# Patient Record
Sex: Female | Born: 1980 | Race: White | Hispanic: Yes | Marital: Single | Smoking: Former smoker
Health system: Southern US, Community
[De-identification: ages and names within clinical notes are randomized; demographics above are authoritative.]

## PROBLEM LIST (undated history)

## (undated) DIAGNOSIS — B009 Herpesviral infection, unspecified: Secondary | ICD-10-CM

## (undated) HISTORY — DX: Herpesviral infection, unspecified: B00.9

## (undated) HISTORY — PX: TUBAL LIGATION: SHX77

---

## 2007-07-24 ENCOUNTER — Emergency Department (HOSPITAL_COMMUNITY): Admission: EM | Admit: 2007-07-24 | Discharge: 2007-07-25 | Payer: Self-pay | Admitting: Emergency Medicine

## 2008-04-24 ENCOUNTER — Encounter (INDEPENDENT_AMBULATORY_CARE_PROVIDER_SITE_OTHER): Payer: Self-pay | Admitting: *Deleted

## 2008-04-24 ENCOUNTER — Ambulatory Visit (HOSPITAL_BASED_OUTPATIENT_CLINIC_OR_DEPARTMENT_OTHER): Admission: RE | Admit: 2008-04-24 | Discharge: 2008-04-24 | Payer: Self-pay | Admitting: *Deleted

## 2009-09-03 ENCOUNTER — Other Ambulatory Visit: Admission: RE | Admit: 2009-09-03 | Discharge: 2009-09-03 | Payer: Self-pay | Admitting: Obstetrics and Gynecology

## 2011-04-27 NOTE — Op Note (Signed)
NAMEKAMIA, INSALACO               ACCOUNT NO.:  [id number]   MEDICAL RECORD NO.:  [id number]          PATIENT TYPE:  AMB   LOCATION:  DSC                          FACILITY:  [facility]   PHYSICIAN:  Tennis Must Meyerdierks, M.D.DATE OF BIRTH:  April 06, 1981   DATE OF PROCEDURE:  04/24/2008  DATE OF DISCHARGE:                               OPERATIVE REPORT   PREOPERATIVE DIAGNOSIS:  Volar ganglion, right wrist.   POSTOPERATIVE DIAGNOSIS:  Volar ganglion, right wrist.   PROCEDURE:  Excision of volar ganglion, right wrist.   SURGEON:  Lowell Bouton, MD   ANESTHESIA:  General.   OPERATIVE FINDINGS:  The patient had a multilobulated ganglion that  appeared to arise from the radioscaphoid joint.  It was densely adherent  to the radial artery.   PROCEDURE:  Under general anesthesia with the tourniquet on the right  arm, the right hand was prepped and draped in usual fashion and after  exsanguinating the limb, the tourniquet was inflated to 250 mmHg.  Gently curved longitudinal incision was made over the radial side of the  wrist and carried down through the subcutaneous tissues.  Bleeding  points were coagulated.  Blunt dissection was carried down to the mass  and the radial artery was identified proximally and distally.  It was  dissected out and branches towards the cyst were coagulated.  The artery  was then retracted and protected.  The cyst was then dissected out and  the vena comitans were coagulated.  The cyst was traced down to the  volar ligaments and appeared to arise from the radioscaphoid joint.  The  volar ligaments were incised and the cyst was completely traced down to  the joint and the stalk was removed.  The joint was examined with a  Therapist, nutritional and appeared to be smooth.  The capsule of the joint was  coagulated.  The ligament was then closed with 4-0 Vicryl suture.  The  tourniquet was released and bleeding was controlled with electrocautery.  A vessel  loop drain was left in for drainage and the subcutaneous tissue  was closed with 4-0 Vicryl.  The skin with a 3-0 subcuticular Prolene.  Steri-Strips were applied and 0.5% Marcaine was placed in the skin edges  for pain control.  The patient was placed in volar wrist splint.  She  tolerated the procedure well and went to the recovery room awake and  stable in good condition.      Lowell Bouton, M.D.  Electronically Signed     EMM/MEDQ  D:  04/24/2008  T:  04/25/2008  Job:  [id number]   cc:   Laverle Hobby, M.D.

## 2011-09-09 ENCOUNTER — Other Ambulatory Visit (HOSPITAL_COMMUNITY)
Admission: RE | Admit: 2011-09-09 | Discharge: 2011-09-09 | Disposition: A | Discharge status: Home / Self Care | Payer: Self-pay | Source: Ambulatory Visit | Attending: Obstetrics & Gynecology | Admitting: Obstetrics & Gynecology

## 2011-09-09 ENCOUNTER — Other Ambulatory Visit: Payer: Self-pay | Admitting: Obstetrics & Gynecology

## 2011-09-09 DIAGNOSIS — Z01419 Encounter for gynecological examination (general) (routine) without abnormal findings: Secondary | ICD-10-CM | POA: Insufficient documentation

## 2011-09-27 LAB — DIFFERENTIAL
Basophils Absolute: 0
Basophils Relative: 1
Eosinophils Absolute: 0
Eosinophils Relative: 1
Lymphocytes Relative: 21
Lymphs Abs: 1.5
Monocytes Absolute: 0.5
Monocytes Relative: 6
Neutro Abs: 5.1
Neutrophils Relative %: 72

## 2011-09-27 LAB — BASIC METABOLIC PANEL
BUN: 7
CO2: 22
Calcium: 9.2
Chloride: 107
Creatinine, Ser: 0.69
GFR calc Af Amer: >60
GFR calc non Af Amer: >60
Glucose, Bld: 127 — ABNORMAL HIGH
Potassium: 3.3 — ABNORMAL LOW
Sodium: 137

## 2011-09-27 LAB — CBC
HCT: 37.5
Hemoglobin: 13
MCHC: 34.8
MCV: 82.2
Platelets: 207
RBC: 4.57
RDW: 13.3
WBC: 7.1

## 2011-09-27 LAB — POCT CARDIAC MARKERS
CKMB, poc: <1 — ABNORMAL LOW
Myoglobin, poc: 45.5
Operator id: 240821
Troponin i, poc: <0.05

## 2015-04-16 ENCOUNTER — Ambulatory Visit (HOSPITAL_COMMUNITY)
Admission: RE | Admit: 2015-04-16 | Discharge: 2015-04-16 | Disposition: A | Discharge status: Home / Self Care | Payer: BLUE CROSS/BLUE SHIELD | Source: Ambulatory Visit | Attending: General Surgery | Admitting: General Surgery

## 2015-04-16 ENCOUNTER — Other Ambulatory Visit (HOSPITAL_COMMUNITY): Payer: Self-pay | Admitting: General Surgery

## 2015-04-16 DIAGNOSIS — Z87891 Personal history of nicotine dependence: Secondary | ICD-10-CM | POA: Insufficient documentation

## 2015-04-16 DIAGNOSIS — R079 Chest pain, unspecified: Secondary | ICD-10-CM | POA: Diagnosis present

## 2015-04-16 DIAGNOSIS — R509 Fever, unspecified: Secondary | ICD-10-CM

## 2015-04-16 DIAGNOSIS — R918 Other nonspecific abnormal finding of lung field: Secondary | ICD-10-CM | POA: Insufficient documentation

## 2015-04-17 ENCOUNTER — Emergency Department (HOSPITAL_COMMUNITY): Payer: BLUE CROSS/BLUE SHIELD

## 2015-04-17 ENCOUNTER — Encounter (HOSPITAL_COMMUNITY): Payer: Self-pay | Admitting: Emergency Medicine

## 2015-04-17 ENCOUNTER — Emergency Department (HOSPITAL_COMMUNITY)
Admission: EM | Admit: 2015-04-17 | Discharge: 2015-04-17 | Disposition: A | Discharge status: Home / Self Care | Payer: BLUE CROSS/BLUE SHIELD | Attending: Emergency Medicine | Admitting: Emergency Medicine

## 2015-04-17 DIAGNOSIS — J40 Bronchitis, not specified as acute or chronic: Secondary | ICD-10-CM | POA: Diagnosis not present

## 2015-04-17 DIAGNOSIS — R52 Pain, unspecified: Secondary | ICD-10-CM

## 2015-04-17 DIAGNOSIS — R05 Cough: Secondary | ICD-10-CM | POA: Diagnosis present

## 2015-04-17 DIAGNOSIS — Z79899 Other long term (current) drug therapy: Secondary | ICD-10-CM | POA: Insufficient documentation

## 2015-04-17 DIAGNOSIS — R091 Pleurisy: Secondary | ICD-10-CM | POA: Diagnosis not present

## 2015-04-17 LAB — CBC WITH DIFFERENTIAL/PLATELET
Basophils Absolute: 0 10*3/uL (ref 0.0–0.1)
Basophils Relative: 0 % (ref 0–1)
Eosinophils Absolute: 0.2 10*3/uL (ref 0.0–0.7)
Eosinophils Relative: 2 % (ref 0–5)
HCT: 34.4 % — ABNORMAL LOW (ref 36.0–46.0)
Hemoglobin: 11.5 g/dL — ABNORMAL LOW (ref 12.0–15.0)
Lymphocytes Relative: 23 % (ref 12–46)
Lymphs Abs: 1.9 10*3/uL (ref 0.7–4.0)
MCH: 24.7 pg — ABNORMAL LOW (ref 26.0–34.0)
MCHC: 33.4 g/dL (ref 30.0–36.0)
MCV: 73.8 fL — ABNORMAL LOW (ref 78.0–100.0)
Monocytes Absolute: 0.6 10*3/uL (ref 0.1–1.0)
Monocytes Relative: 8 % (ref 3–12)
Neutro Abs: 5.5 10*3/uL (ref 1.7–7.7)
Neutrophils Relative %: 67 % (ref 43–77)
Platelets: 224 10*3/uL (ref 150–400)
RBC: 4.66 MIL/uL (ref 3.87–5.11)
RDW: 15.3 % (ref 11.5–15.5)
WBC: 8.2 10*3/uL (ref 4.0–10.5)

## 2015-04-17 LAB — COMPREHENSIVE METABOLIC PANEL
ALT: 36 U/L (ref 14–54)
AST: 31 U/L (ref 15–41)
Albumin: 4 g/dL (ref 3.5–5.0)
Alkaline Phosphatase: 67 U/L (ref 38–126)
Anion gap: 7 (ref 5–15)
BUN: 14 mg/dL (ref 6–20)
CO2: 24 mmol/L (ref 22–32)
Calcium: 9 mg/dL (ref 8.9–10.3)
Chloride: 107 mmol/L (ref 101–111)
Creatinine, Ser: 0.7 mg/dL (ref 0.44–1.00)
GFR calc Af Amer: >60 mL/min (ref >60)
GFR calc non Af Amer: >60 mL/min (ref >60)
Glucose, Bld: 104 mg/dL — ABNORMAL HIGH (ref 70–99)
Potassium: 3.5 mmol/L (ref 3.5–5.1)
Sodium: 138 mmol/L (ref 135–145)
Total Bilirubin: 0.9 mg/dL (ref 0.3–1.2)
Total Protein: 7.1 g/dL (ref 6.5–8.1)

## 2015-04-17 LAB — D-DIMER, QUANTITATIVE (NOT AT ARMC): D-Dimer, Quant: 0.78 ug/mL-FEU — ABNORMAL HIGH (ref 0.00–0.48)

## 2015-04-17 MED ORDER — IOHEXOL 350 MG/ML SOLN
100.0000 mL | Freq: Once | INTRAVENOUS | Status: AC | PRN
  Administered 2015-04-17: 100 mL via INTRAVENOUS

## 2015-04-17 MED ORDER — SODIUM CHLORIDE 0.9 % IV BOLUS (SEPSIS)
500.0000 mL | Freq: Once | INTRAVENOUS | Status: AC
  Administered 2015-04-17: 500 mL via INTRAVENOUS

## 2015-04-17 MED ORDER — HYDROMORPHONE HCL 1 MG/ML IJ SOLN
0.5000 mg | Freq: Once | INTRAMUSCULAR | Status: AC
  Administered 2015-04-17: 0.5 mg via INTRAVENOUS
  Filled 2015-04-17: qty 1

## 2015-04-17 MED ORDER — ONDANSETRON HCL 4 MG/2ML IJ SOLN
4.0000 mg | Freq: Once | INTRAMUSCULAR | Status: AC
  Administered 2015-04-17: 4 mg via INTRAVENOUS
  Filled 2015-04-17: qty 2

## 2015-04-17 MED ORDER — TRAMADOL HCL 50 MG PO TABS: 50.0000 mg | ORAL_TABLET | Freq: Four times a day (QID) | ORAL | Status: DC | PRN

## 2015-04-17 NOTE — ED Provider Notes (Signed)
CSN: [id number]     Arrival date & time 04/17/15  1648 History   First MD Initiated Contact with Patient 04/17/15 1725     Chief Complaint  Patient presents with  . Pneumonia     (Consider location/radiation/quality/duration/timing/severity/associated sxs/prior Treatment) Patient is a 34 y.o. female presenting with cough. The history is provided by the patient (pt complains of a cough and left chest pain.  chest x-ray shows infiltrate).  Cough Cough characteristics:  Non-productive Severity:  Moderate Onset quality:  Gradual Timing:  Constant Progression:  Waxing and waning Chronicity:  New Associated symptoms: no chest pain, no eye discharge, no headaches and no rash     History reviewed. No pertinent past medical history. Past Surgical History  Procedure Laterality Date  . Cesarean section     History reviewed. No pertinent family history. History  Substance Use Topics  . Smoking status: Never Smoker   . Smokeless tobacco: Not on file  . Alcohol Use: No   OB History    No data available     Review of Systems  Constitutional: Negative for appetite change and fatigue.  HENT: Negative for congestion, ear discharge and sinus pressure.   Eyes: Negative for discharge.  Respiratory: Positive for cough.   Cardiovascular: Negative for chest pain.  Gastrointestinal: Negative for abdominal pain and diarrhea.  Genitourinary: Negative for frequency and hematuria.  Musculoskeletal: Negative for back pain.  Skin: Negative for rash.  Neurological: Negative for seizures and headaches.  Psychiatric/Behavioral: Negative for hallucinations.      Allergies  Review of patient's allergies indicates no known allergies.  Home Medications   Prior to Admission medications   Medication Sig Start Date End Date Taking? Authorizing Provider  levofloxacin (LEVAQUIN) 500 MG tablet Take 500 mg by mouth daily. 7 day course starting on 04/16/2015 04/16/15  Yes Historical Provider, MD  Multiple  Vitamin (MULTIVITAMIN WITH MINERALS) TABS tablet Take 1 tablet by mouth daily.   Yes Historical Provider, MD  naproxen sodium (ANAPROX) 220 MG tablet Take 220-440 mg by mouth daily as needed (for pain).   Yes Historical Provider, MD  traMADol (ULTRAM) 50 MG tablet Take 1 tablet (50 mg total) by mouth every 6 (six) hours as needed. 04/17/15   Bethann BerkshireJoseph Kullen Tomasetti, MD   BP 109/60 mmHg  Pulse 70  Temp(Src) 98.5 F (36.9 C) (Oral)  Resp 18  Ht 5\' 4"  (1.626 m)  Wt 192 lb (87.091 kg)  BMI 32.94 kg/m2  SpO2 100%  LMP 04/16/2015 Physical Exam  Constitutional: She is oriented to person, place, and time. She appears well-developed.  HENT:  Head: Normocephalic.  Eyes: Conjunctivae and EOM are normal. No scleral icterus.  Neck: Neck supple. No thyromegaly present.  Cardiovascular: Normal rate and regular rhythm.  Exam reveals no gallop and no friction rub.   No murmur heard. Pulmonary/Chest: No stridor. She has no wheezes. She has no rales. She exhibits no tenderness.  Abdominal: She exhibits no distension. There is no tenderness. There is no rebound.  Musculoskeletal: Normal range of motion. She exhibits no edema.  Lymphadenopathy:    She has no cervical adenopathy.  Neurological: She is oriented to person, place, and time. She exhibits normal muscle tone. Coordination normal.  Skin: No rash noted. No erythema.  Psychiatric: She has a normal mood and affect. Her behavior is normal.    ED Course  Procedures (including critical care time) Labs Review Labs Reviewed  D-DIMER, QUANTITATIVE - Abnormal; Notable for the following:    D-Dimer,  Quant 0.78 (*)    All other components within normal limits  CBC WITH DIFFERENTIAL/PLATELET - Abnormal; Notable for the following:    Hemoglobin 11.5 (*)    HCT 34.4 (*)    MCV 73.8 (*)    MCH 24.7 (*)    All other components within normal limits  COMPREHENSIVE METABOLIC PANEL - Abnormal; Notable for the following:    Glucose, Bld 104 (*)    All other  components within normal limits    Imaging Review Dg Chest 2 View  04/16/2015   CLINICAL DATA:  Three days of left-sided chest pain anteriorly and laterally, pleuritic positional component, history of previous tobacco use.  EXAM: CHEST  2 VIEW  COMPARISON:  Portable chest x-ray of July 24, 2007 and October 16, 2011  FINDINGS: The lungs are mildly hyperinflated. There are coarse lung markings in the lingula which are new since 2012. There are no air bronchograms. There is a trace of fluid in the left lateral and posterior costophrenic gutter. The heart and pulmonary vascularity are normal. The mediastinum is normal in width. There is stable S shaped thoracolumbar scoliosis.  IMPRESSION: Mild subsegmental atelectasis or infiltrate in the lingula with trace left pleural effusion.  Follow-up radiographs following anticipated antibiotic therapy are recommended unless the patient's symptoms resolve completely.   Electronically Signed   By: David  SwazilandJordan M.D.   On: 04/16/2015 17:30   Ct Angio Chest Pe W/cm &/or Wo Cm  04/17/2015   CLINICAL DATA:  Right-sided chest pain. Recent diagnosis of pneumonia.  EXAM: CT ANGIOGRAPHY CHEST WITH CONTRAST  TECHNIQUE: Multidetector CT imaging of the chest was performed using the standard protocol during bolus administration of intravenous contrast. Multiplanar CT image reconstructions and MIPs were obtained to evaluate the vascular anatomy.  CONTRAST:  100mL OMNIPAQUE IOHEXOL 350 MG/ML SOLN  COMPARISON:  None.  FINDINGS: THORACIC INLET/BODY WALL:  No acute abnormality.  MEDIASTINUM:  Normal heart size. No pericardial effusion. No acute vascular abnormality, including aortic dissection or pulmonary embolism. No adenopathy.  LUNG WINDOWS:  Small, layering and simple appearing left pleural effusion with atelectasis. Previously noted lingular opacity is combination of fat and subsegmental atelectasis. No edema or pneumothorax.  UPPER ABDOMEN:  No acute findings.  OSSEOUS:  No acute  fracture.  No suspicious lytic or blastic lesions.  Review of the MIP images confirms the above findings.  IMPRESSION: 1. Small left pleural effusion with subsegmental atelectasis. 2. Negative for pulmonary embolism.   Electronically Signed   By: Marnee SpringJonathon  Watts M.D.   On: 04/17/2015 20:10     EKG Interpretation None      MDM   Final diagnoses:  Pain  Pleuritis  Bronchitis    Bronchitis and pleuritis,  Continue antibioitcs rx with ultram,  Follow up as needed    Bethann BerkshireJoseph Lea Walbert, MD 04/17/15 2021

## 2015-04-17 NOTE — ED Notes (Signed)
Heather Weber was sent here today for chest xray and was told by her Dr Mayford KnifeWilliams that she has pneumonia and to come to ED. Heather Weber complaining of chest pain, fever, and headaches x 5 days.

## 2015-04-17 NOTE — Discharge Instructions (Signed)
Continue your antibiotics and follow up next week if not improving.

## 2015-04-28 ENCOUNTER — Other Ambulatory Visit: Payer: Self-pay | Admitting: Obstetrics & Gynecology

## 2015-04-28 ENCOUNTER — Encounter: Payer: Self-pay | Admitting: Obstetrics & Gynecology

## 2016-07-28 ENCOUNTER — Ambulatory Visit (INDEPENDENT_AMBULATORY_CARE_PROVIDER_SITE_OTHER): Payer: Medicaid Other | Admitting: Obstetrics and Gynecology

## 2016-07-28 ENCOUNTER — Ambulatory Visit: Payer: Self-pay | Admitting: Obstetrics and Gynecology

## 2016-07-28 ENCOUNTER — Encounter: Payer: Self-pay | Admitting: Obstetrics and Gynecology

## 2016-07-28 VITALS — BP 110/70 | Ht 64.0 in | Wt 204.0 lb

## 2016-07-28 DIAGNOSIS — R300 Dysuria: Secondary | ICD-10-CM

## 2016-07-28 DIAGNOSIS — N9089 Other specified noninflammatory disorders of vulva and perineum: Secondary | ICD-10-CM | POA: Diagnosis not present

## 2016-07-28 MED ORDER — PENCICLOVIR 1 % EX CREA: TOPICAL_CREAM | CUTANEOUS | Status: AC

## 2016-07-28 NOTE — Progress Notes (Signed)
Patient ID: Heather Weber, female   DOB: 08/01/1981, 35 y.o.   MRN: [id number]    [facility]  @DATE @            Patient name: Heather Weber MRN [id number]  Date of birth: 06/26/1981  CC & HPI:   Chief Complaint  Patient presents with  . Dysuria    and burning     Heather Weber is a 35 y.o. female presenting today for moderate dysuria and labial irritation onset 3 days ago. Pt states that she has been applying an OTC topical ointment to her labia for yeast infection and it has provided no relief of symptoms. She reports her labial irritation is worsened with urination, palpation and direct pressure. No h/o yeast infection. Pt states she was last sexually active 2 weeks ago and reports it was not painful.   ROS:  Review of Systems  Genitourinary: Positive for dysuria.       +labial irritation    Pertinent History Reviewed:   Reviewed: Significant for cesarean section, tubal ligation   Medical        History reviewed. No pertinent past medical history.                            Surgical Hx:    Past Surgical History:  Procedure Laterality Date  . CESAREAN SECTION    . TUBAL LIGATION     Medications: Reviewed & Updated - see associated section                      No current outpatient prescriptions on file.   Social History: Reviewed -  reports that she has quit smoking. She has never used smokeless tobacco.  Objective Findings:  Vitals: Blood pressure 110/70, height 5\' 4"  (1.626 m), weight 204 lb (92.5 kg), last menstrual period 07/07/2016.  Physical Examination: General appearance - alert, well appearing, and in no distress Mental status - alert, oriented to person, place, and time Abdomen - soft, nontender, nondistended, no masses or organomegaly Pelvic -  VULVA: normal appearing vulva with no masses, 3-4 cracks in the left labia minora that are tender with palpation  VAGINA: normal appearing vagina with normal color and discharge, no lesions,  CERVIX:  normal appearing cervix without discharge or lesions Musculoskeletal - no joint tenderness, deformity or swelling Extremities - peripheral pulses normal, no pedal edema, no clubbing or cyanosis Skin - normal coloration and turgor, no rashes, no suspicious skin lesions noted   Assessment & Plan:   A:  1. Dysuria and labial irritation x 3 days  2. UA negative today  3. Left labial excoriation, r/o HSV, unable to culture d/t current medications   P:  1. Will rx topical Denavir 2. Avoid sexual activity during healing  3. F/u with recurrent symptoms or in a month for further testing  , consider HSV II testing serology    By signing my name below, I, Doreatha MartinEva Mathews, attest that this documentation has been prepared under the direction and in the presence of Tilda BurrowJohn V Jaeley Wiker, MD. Electronically Signed: Doreatha MartinEva Mathews, ED Scribe. 07/28/16. 4:29 PM.  I personally performed the services described in this documentation, which was SCRIBED in my presence. The recorded information has been reviewed and considered accurate. It has been edited as necessary during review. Tilda BurrowFERGUSON,Irina Okelly V, MD

## 2016-07-29 ENCOUNTER — Telehealth: Payer: Self-pay | Admitting: Obstetrics and Gynecology

## 2016-07-29 MED ORDER — ACYCLOVIR 400 MG PO TABS: 400.0000 mg | ORAL_TABLET | Freq: Every day | ORAL | 1 refills | Status: DC

## 2016-07-29 NOTE — Telephone Encounter (Signed)
Acyclovir 400 mt 5x/d called to pharmacy by escribe.

## 2016-08-03 ENCOUNTER — Ambulatory Visit: Payer: Self-pay | Admitting: Obstetrics & Gynecology

## 2016-08-25 ENCOUNTER — Ambulatory Visit: Payer: Medicaid Other | Admitting: Obstetrics and Gynecology

## 2016-09-14 ENCOUNTER — Telehealth: Payer: Self-pay | Admitting: Obstetrics and Gynecology

## 2016-09-14 NOTE — Telephone Encounter (Signed)
Pt states she missed her appt with Dr.Ferguson and wants testing for Herpes. Pt given an appt with Cyril MourningJennifer Griffin, Np, 09/16/2016.

## 2016-09-16 ENCOUNTER — Encounter: Payer: Self-pay | Admitting: Advanced Practice Midwife

## 2016-09-16 ENCOUNTER — Ambulatory Visit (INDEPENDENT_AMBULATORY_CARE_PROVIDER_SITE_OTHER): Payer: Medicaid Other | Admitting: Advanced Practice Midwife

## 2016-09-16 VITALS — BP 108/80 | HR 72 | Wt 202.0 lb

## 2016-09-16 DIAGNOSIS — N898 Other specified noninflammatory disorders of vagina: Secondary | ICD-10-CM

## 2016-09-16 NOTE — Progress Notes (Signed)
[facility] Visit  Patient name: Heather Weber MRN [id number]  Date of birth: 01/04/1981  CC & HPI:  Heather SiasRosa Layton is a 35 y.o. Hispanic female presenting today for HSV 2 blood test.  Had labial lesion in August that was not culturable d/t pts meds. IgG was discussed at that visit.  Pt has not had another outbreak. I discussed with her the limitations of a blood test, specifically a 50% false positive rate. The false negative rate is presumed to be much lower, so the value of the test is only if the results are negative.  I offered her the test but she declined.  Pertinent History Reviewed:  Medical & Surgical Hx:   Past Medical History:  Diagnosis Date  . Herpes simplex virus (HSV) infection    Past Surgical History:  Procedure Laterality Date  . CESAREAN SECTION    . TUBAL LIGATION     Family History  Problem Relation Age of Onset  . Hypertension Mother   . Diabetes Mother    No current outpatient prescriptions on file.  Current Facility-Administered Medications:  .  penciclovir (DENAVIR) 1 % cream, , Topical, Q2H, Tilda BurrowJohn V Ferguson, MD Social History: Reviewed -  reports that she has quit smoking. She has never used smokeless tobacco.  Review of Systems:   Denies HA, visual changes, vaginal itch, irritation, lesions.   Objective Findings:    Physical Examination: General appearance - well appearing, and in no distress Mental status - alert, oriented to person, place, and time Chest:  Normal respiratory effort Heart - normal rate and regular rhythm Musculoskeletal:  Normal range of motion without pain Extremities:  No edema    No results found for this or any previous visit (from the past 24 hour(s)).    Assessment & Plan:  A:   Possible HSV outb reak in August P:  F/U ASAP if has another "outbreak" for culture   No Follow-up on file.  CRESENZO-DISHMAN,Hester Joslin CNM 09/16/2016 5:05 PM

## 2016-11-01 IMAGING — CT CT ANGIO CHEST
2 of 6 series · 6 of 36 positions shown · IV contrast (Omnipaque 300)
Comparison: None.

CLINICAL DATA: Right-sided chest pain. Recent diagnosis of
pneumonia.

EXAM:
CT ANGIOGRAPHY CHEST WITH CONTRAST
TECHNIQUE: Multidetector CT imaging of the chest was performed using the
standard protocol during bolus administration of intravenous
contrast. Multiplanar CT image reconstructions and MIPs were
obtained to evaluate the vascular anatomy.
CONTRAST:  100mL OMNIPAQUE IOHEXOL 350 MG/ML SOLN

[Series 7: pe 3.0 b40f · axial · 0.59mm/px · z∈[-225,-48]mm · 5 of 89 slices shown]
[im 15/89  lung]
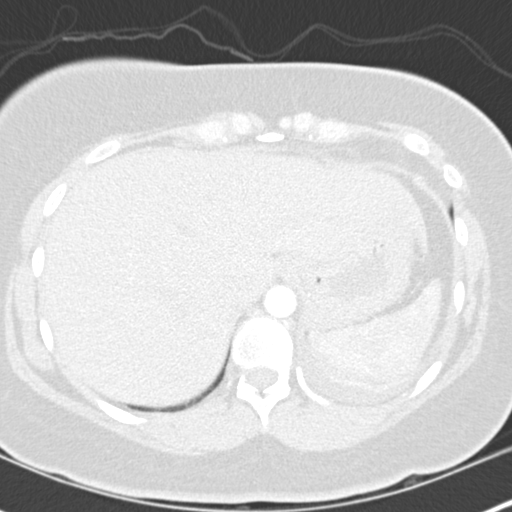
[im 30/89  mediastinal]
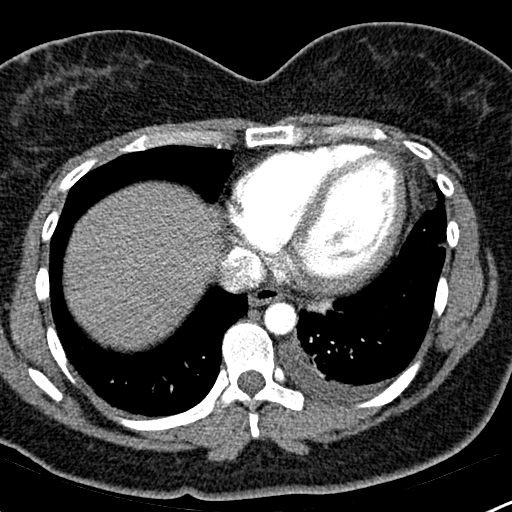
[im 45/89  lung]
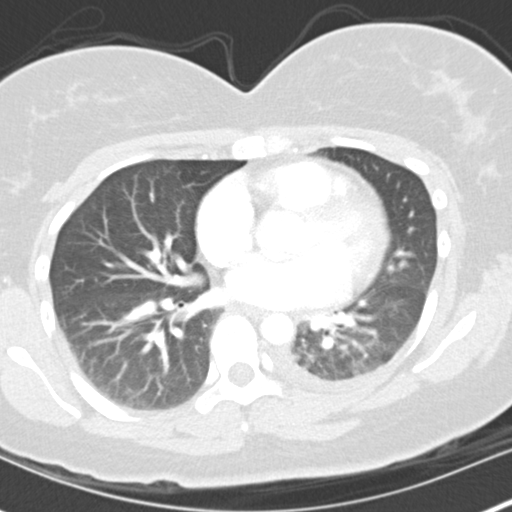
[im 59/89  mediastinal]
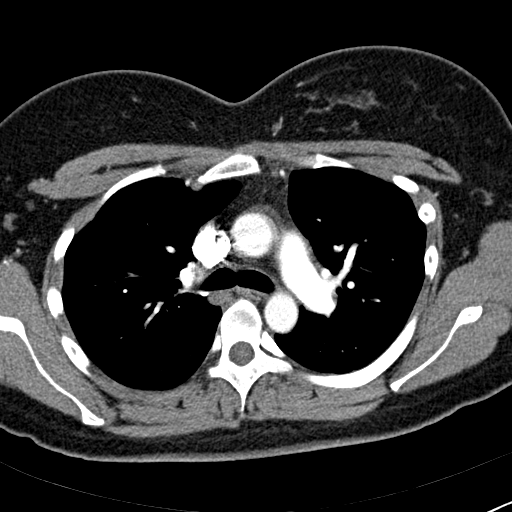
[im 74/89  lung]
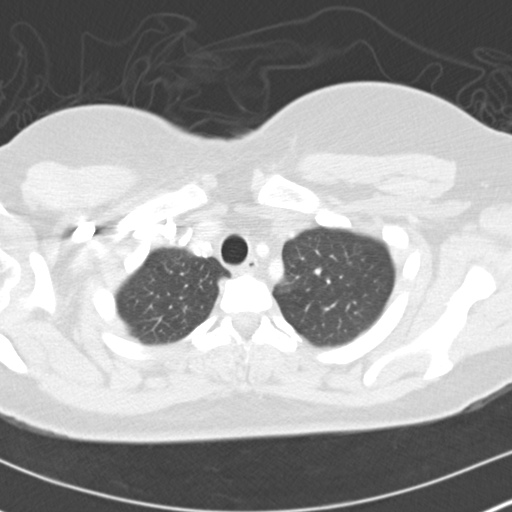

[Series 9: mpr coronal pe 3mm · coronal · 0.54mm/px · 1 of 79 slices shown]
[im 40/79  mediastinal]
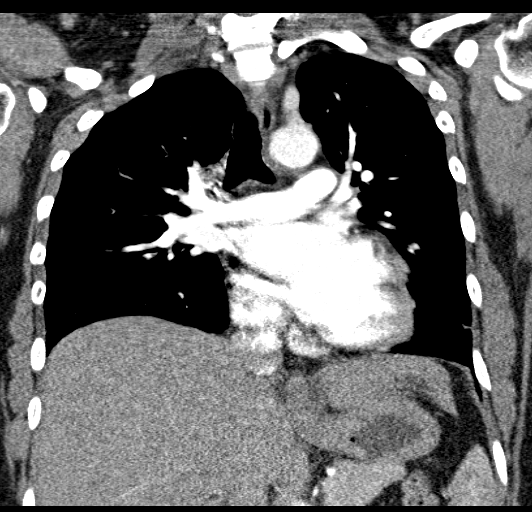

[6 of 36 positions shown; findings below may reference images not displayed]

FINDINGS: THORACIC INLET/BODY WALL:

No acute abnormality.

MEDIASTINUM:

Normal heart size. No pericardial effusion. No acute vascular
abnormality, including aortic dissection or pulmonary embolism. No
adenopathy.

LUNG WINDOWS:

Small, layering and simple appearing left pleural effusion with
atelectasis. Previously noted lingular opacity is combination of fat
and subsegmental atelectasis. No edema or pneumothorax.

UPPER ABDOMEN:

No acute findings.

OSSEOUS:

No acute fracture.  No suspicious lytic or blastic lesions.

Review of the MIP images confirms the above findings.
IMPRESSION: 1. Small left pleural effusion with subsegmental atelectasis.
2. Negative for pulmonary embolism.

## 2017-01-10 ENCOUNTER — Ambulatory Visit (INDEPENDENT_AMBULATORY_CARE_PROVIDER_SITE_OTHER): Payer: BLUE CROSS/BLUE SHIELD | Admitting: Otolaryngology

## 2017-11-15 ENCOUNTER — Other Ambulatory Visit: Payer: Self-pay | Admitting: Adult Health

## 2018-02-17 ENCOUNTER — Encounter: Payer: Self-pay | Admitting: Adult Health

## 2018-02-17 ENCOUNTER — Ambulatory Visit (INDEPENDENT_AMBULATORY_CARE_PROVIDER_SITE_OTHER): Payer: BLUE CROSS/BLUE SHIELD | Admitting: Adult Health

## 2018-02-17 ENCOUNTER — Other Ambulatory Visit (HOSPITAL_COMMUNITY)
Admission: RE | Admit: 2018-02-17 | Discharge: 2018-02-17 | Disposition: A | Discharge status: Home / Self Care | Payer: BLUE CROSS/BLUE SHIELD | Source: Ambulatory Visit | Attending: Adult Health | Admitting: Adult Health

## 2018-02-17 VITALS — BP 100/60 | HR 95 | Ht 64.0 in | Wt 204.0 lb

## 2018-02-17 DIAGNOSIS — Z01419 Encounter for gynecological examination (general) (routine) without abnormal findings: Secondary | ICD-10-CM

## 2018-02-17 NOTE — Progress Notes (Signed)
Patient ID: Heather Weber, female   DOB: 11/11/1981, 37 y.o.   MRN: [id number] History of Present Illness: Heather Weber is a 37 year old Hispanic female,single, G2P2, sp tubal in for well woman gyn exam and pap.Last pap in 2012. [facility]    Current Medications, Allergies, Past Medical History, Past Surgical History, Family History and Social History were [facility] electronic [facility].     Review of Systems:  Patient denies any daily headaches, hearing loss, fatigue, blurred vision, shortness of breath, chest pain, abdominal pain, problems with bowel movements, urination, or intercourse. No joint pain or mood swings.   Physical Exam:BP 100/60 (BP Location: Right Arm, Patient Position: Sitting, Cuff Size: Large)   Pulse 95   Ht 5\' 4"  (1.626 m)   Wt 204 lb (92.5 kg)   LMP 01/22/2018   BMI 35.02 kg/m  General:  Well developed, well nourished, no acute distress Skin:  Warm and dry Neck:  Midline trachea, normal thyroid, good ROM, no lymphadenopathy Lungs; Clear to auscultation bilaterally Breast:  No dominant palpable mass, retraction, or nipple discharge Cardiovascular: Regular rate and rhythm Abdomen:  Soft, non tender, no hepatosplenomegaly Pelvic:  External genitalia is normal in appearance, no lesions.  The vagina is normal in appearance. Urethra has no lesions or masses. The cervix is bulbous,and smooth, pap with HPV performed.  Uterus is felt to be normal size, shape, and contour.  No adnexal masses or tenderness noted.Bladder is non tender, no masses felt. Extremities/musculoskeletal:  No swelling or varicosities noted, no clubbing or cyanosis Psych:  No mood changes, alert and cooperative,seems happy PHQ 9 score 0.  Impression: 1. Encounter for gynecological examination with Papanicolaou smear of cervix       Plan: Physical in 1 year Pap in 3 years if normal Mammogram at 40.

## 2018-02-17 NOTE — Addendum Note (Signed)
Addended by: Federico FlakeNES, PEGGY A on: 02/17/2018 01:32 PM   Modules accepted: Orders

## 2018-02-20 LAB — CYTOLOGY - PAP
Diagnosis: NEGATIVE
HPV: NOT DETECTED

## 2020-12-25 ENCOUNTER — Other Ambulatory Visit: Payer: BC Managed Care – PPO

## 2020-12-25 DIAGNOSIS — Z20822 Contact with and (suspected) exposure to covid-19: Secondary | ICD-10-CM

## 2020-12-30 LAB — NOVEL CORONAVIRUS, NAA

## 2021-02-03 ENCOUNTER — Other Ambulatory Visit: Payer: Self-pay

## 2021-02-03 ENCOUNTER — Ambulatory Visit (INDEPENDENT_AMBULATORY_CARE_PROVIDER_SITE_OTHER): Payer: BC Managed Care – PPO | Admitting: Women's Health

## 2021-02-03 ENCOUNTER — Encounter: Payer: Self-pay | Admitting: Women's Health

## 2021-02-03 ENCOUNTER — Other Ambulatory Visit (HOSPITAL_COMMUNITY)
Admission: RE | Admit: 2021-02-03 | Discharge: 2021-02-03 | Disposition: A | Discharge status: Home / Self Care | Payer: BC Managed Care – PPO | Source: Ambulatory Visit | Attending: Obstetrics & Gynecology | Admitting: Obstetrics & Gynecology

## 2021-02-03 VITALS — BP 124/81 | HR 81 | Ht 64.0 in | Wt 220.0 lb

## 2021-02-03 DIAGNOSIS — Z1231 Encounter for screening mammogram for malignant neoplasm of breast: Secondary | ICD-10-CM

## 2021-02-03 DIAGNOSIS — N9089 Other specified noninflammatory disorders of vulva and perineum: Secondary | ICD-10-CM | POA: Diagnosis not present

## 2021-02-03 DIAGNOSIS — Z01419 Encounter for gynecological examination (general) (routine) without abnormal findings: Secondary | ICD-10-CM | POA: Insufficient documentation

## 2021-02-03 MED ORDER — VALACYCLOVIR HCL 1 G PO TABS: 1000.0000 mg | ORAL_TABLET | Freq: Every day | ORAL | 3 refills | Status: AC

## 2021-02-03 NOTE — Progress Notes (Signed)
WELL-WOMAN EXAMINATION Patient name: Heather Weber MRN [id number]  Date of birth: 30-Jun-1981 Chief Complaint:   Gynecologic Exam  History of Present Illness:   Heather Weber is a 40 y.o. G2P2 Hispanic female being seen today for a routine well-woman exam.  Current complaints: frequent HSV outbreaks, wants suppressive therapy  Depression screen [facility] 2/9 02/03/2021 02/17/2018  Decreased Interest 0 0  Down, Depressed, Hopeless 0 0  PHQ - 2 Score 0 0  Altered sleeping 0 0  Tired, decreased energy 0 0  Change in appetite 0 0  Feeling bad or failure about yourself  0 0  Trouble concentrating 0 -  Moving slowly or fidgety/restless 0 -  Suicidal thoughts 0 0  PHQ-9 Score 0 0     PCP: none      Only wants HSV testing- doesn't have active lesion, discussed limitations of serum HSV screening Patient's last menstrual period was 01/09/2021. The current method of family planning is tubal ligation.  Last pap 2019. Results were: NILM w/ HRHPV negative. H/O abnormal pap: no Last mammogram: never. Results were: N/A. Family h/o breast cancer: no Last colonoscopy: never. Results were: N/A. Family h/o colorectal cancer: no Review of Systems:   Pertinent items are noted in HPI Denies any headaches, blurred vision, fatigue, shortness of breath, chest pain, abdominal pain, abnormal vaginal discharge/itching/odor/irritation, problems with periods, bowel movements, urination, or intercourse unless otherwise stated above. Pertinent History Reviewed:  Reviewed past medical,surgical, social and family history.  Reviewed problem list, medications and allergies. Physical Assessment:   Vitals:   02/03/21 1340  BP: 124/81  Pulse: 81  Weight: 220 lb (99.8 kg)  Height: 5\' 4"  (1.626 m)  Body mass index is 37.76 kg/m.        Physical Examination by , SNP   General appearance - well appearing, and in no distress  Mental status - alert, oriented to person, place, and time  Psych:  She has a  normal mood and affect  Skin - warm and dry, normal color, no suspicious lesions noted  Chest - effort normal, all lung fields clear to auscultation bilaterally  Heart - normal rate and regular rhythm  Neck:  midline trachea, no thyromegaly or nodules  Breasts - breasts appear normal, no suspicious masses, no skin or nipple changes or  axillary nodes  Abdomen - soft, nontender, nondistended, no masses or organomegaly  Pelvic - VULVA: normal appearing vulva with no masses, tenderness or lesions  VAGINA: normal appearing vagina with normal color and discharge, no lesions  CERVIX: normal appearing cervix without discharge or lesions, no CMT  Thin prep pap is done w/ HR HPV cotesting  UTERUS: uterus is felt to be normal size, shape, consistency and nontender   ADNEXA: No adnexal masses or tenderness noted.  Extremities:  No swelling or varicosities noted  Chaperone: me    No results found for this or any previous visit (from the past 24 hour(s)).  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 MS DIGITAL SCREENING TOMO BILATERAL  . HSV(herpes simplex vrs) 1+2 ab-IgG    Meds:  Meds ordered  this encounter  Medications  . valACYclovir (VALTREX) 1000 MG tablet    Sig: Take 1 tablet (1,000 mg total) by mouth daily.    Dispense:  90 tablet    Refill:  3    Order Specific Question:   Supervising Provider    Answer:   Duane Lope H [[id number]]    Follow-up: No follow-ups on file.  Cheral Marker CNM, [facility] 02/03/2021 2:22 PM

## 2021-02-03 NOTE — Patient Instructions (Signed)
Call [phone] to schedule your Mammogram for after 03/16/21

## 2021-02-03 NOTE — Addendum Note (Signed)
Addended by: Annamarie Dawley on: 02/03/2021 03:24 PM   Modules accepted: Orders

## 2021-02-04 LAB — HSV(HERPES SIMPLEX VRS) I + II AB-IGG
HSV 1 Glycoprotein G Ab, IgG: 40 index — ABNORMAL HIGH (ref 0.00–0.90)
HSV 2 IgG, Type Spec: 6.75 index — ABNORMAL HIGH (ref 0.00–0.90)

## 2021-02-05 LAB — CYTOLOGY - PAP
Chlamydia: NEGATIVE
Comment: NEGATIVE
Comment: NEGATIVE
Comment: NORMAL
Diagnosis: NEGATIVE
High risk HPV: NEGATIVE
Neisseria Gonorrhea: NEGATIVE

## 2021-02-09 ENCOUNTER — Encounter: Payer: Self-pay | Admitting: Women's Health

## 2021-02-09 DIAGNOSIS — B009 Herpesviral infection, unspecified: Secondary | ICD-10-CM | POA: Insufficient documentation

## 2022-08-09 ENCOUNTER — Ambulatory Visit: Payer: BC Managed Care – PPO | Admitting: Women's Health

## 2022-09-07 ENCOUNTER — Ambulatory Visit: Payer: BC Managed Care – PPO | Admitting: Women's Health

## 2022-09-08 ENCOUNTER — Other Ambulatory Visit: Payer: Self-pay | Admitting: Women's Health

## 2022-09-08 DIAGNOSIS — Z1231 Encounter for screening mammogram for malignant neoplasm of breast: Secondary | ICD-10-CM

## 2022-09-13 ENCOUNTER — Ambulatory Visit
Admission: RE | Admit: 2022-09-13 | Discharge: 2022-09-13 | Disposition: A | Discharge status: Home / Self Care | Payer: BC Managed Care – PPO | Source: Ambulatory Visit | Attending: Women's Health | Admitting: Women's Health

## 2022-09-13 DIAGNOSIS — Z1231 Encounter for screening mammogram for malignant neoplasm of breast: Secondary | ICD-10-CM

## 2022-09-15 ENCOUNTER — Other Ambulatory Visit: Payer: Self-pay | Admitting: Women's Health

## 2022-09-15 DIAGNOSIS — R928 Other abnormal and inconclusive findings on diagnostic imaging of breast: Secondary | ICD-10-CM

## 2022-09-20 ENCOUNTER — Ambulatory Visit: Payer: BC Managed Care – PPO | Admitting: Women's Health

## 2022-09-20 DIAGNOSIS — Z0289 Encounter for other administrative examinations: Secondary | ICD-10-CM

## 2022-09-29 ENCOUNTER — Ambulatory Visit
Admission: RE | Admit: 2022-09-29 | Discharge: 2022-09-29 | Disposition: A | Discharge status: Home / Self Care | Payer: BC Managed Care – PPO | Source: Ambulatory Visit | Attending: Women's Health | Admitting: Women's Health

## 2022-09-29 DIAGNOSIS — R928 Other abnormal and inconclusive findings on diagnostic imaging of breast: Secondary | ICD-10-CM
